# Patient Record
Sex: Male | Born: 1970 | Race: White | Hispanic: No | Marital: Married | Smoking: Former smoker
Health system: Southern US, Community
[De-identification: ages and names within clinical notes are randomized; demographics above are authoritative.]

## PROBLEM LIST (undated history)

## (undated) DIAGNOSIS — L309 Dermatitis, unspecified: Secondary | ICD-10-CM

## (undated) HISTORY — PX: APPENDECTOMY: SHX54

## (undated) HISTORY — DX: Dermatitis, unspecified: L30.9

---

## 2010-02-10 ENCOUNTER — Encounter (INDEPENDENT_AMBULATORY_CARE_PROVIDER_SITE_OTHER): Payer: Self-pay | Admitting: General Surgery

## 2010-02-10 ENCOUNTER — Ambulatory Visit (HOSPITAL_COMMUNITY): Admission: EM | Admit: 2010-02-10 | Discharge: 2010-02-11 | Payer: Self-pay | Admitting: Emergency Medicine

## 2010-07-29 DIAGNOSIS — S8010XA Contusion of unspecified lower leg, initial encounter: Secondary | ICD-10-CM

## 2010-08-04 ENCOUNTER — Ambulatory Visit: Payer: Self-pay | Admitting: Family Medicine

## 2010-08-04 DIAGNOSIS — F17200 Nicotine dependence, unspecified, uncomplicated: Secondary | ICD-10-CM | POA: Insufficient documentation

## 2010-08-18 ENCOUNTER — Ambulatory Visit: Payer: Self-pay | Admitting: Family Medicine

## 2010-11-18 NOTE — Assessment & Plan Note (Signed)
Summary: TO BE EST/SKIN RASH/NJR   Vital Signs:  Patient profile:   40 year old male Height:      72 inches Weight:      161 pounds BMI:     21.91 Temp:     98.4 degrees F oral BP sitting:   102 / 78  (left arm) Cuff size:   regular  Vitals Entered By: Kern Reap CMA Duncan Dull) (August 04, 2010 12:32 PM) CC: left leg pain, new to establish care Is Patient Diabetic? No   CC:  left leg pain and new to establish care.  History of Present Illness: Grant Castaneda is a 40 year old, married male, smoker, who comes in today as a new patient for evaluation of a contusion on his left leg x 1 week and to discuss a smoking cessation program.  He fell at a friends house and contused.  The medial side of his left leg about a week ago and still sore.  Last tetanus status he was at age 40.  Therefore, will give a booster today because there is a slight abrasion.  He tried a friend's chantix, however, he stopped it because it gave him a bad headache.  His wife also smokes and would like to quit too.  Preventive Screening-Counseling & Management  Alcohol-Tobacco     Smoking Status: current     Packs/Day: 0.5  Hep-HIV-STD-Contraception     Dental Visit-last 6 months yes      Drug Use:  no.    Allergies (verified): No Known Drug Allergies  Past History:  Past medical, surgical, family and social histories (including risk factors) reviewed, and no changes noted (except as noted below).  Past Medical History: ezcema  Past Surgical History: Appendectomy  Family History: Reviewed history and no changes required. Father: diverticulitis Mother: DM II Siblings: 1 brother - achalasia  Social History: Reviewed history and no changes required. 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 no    Any prior history of allergic reactions to egg and/or gelatin? no    Do you have a sensitivity to the preservative Thimersol? no    Do you have a past history of Guillan-Barre Syndrome? no    Do you currently have an acute febrile illness? no    Have you ever had a severe reaction to latex? no    Vaccine information given and explained to patient? yes    Are you currently pregnant? no    Lot Number:[id number]   Exp Date:04/18/2011   Site Given  Right  Deltoid IM   Physical Exam  General:  Well-developed,well-nourished,in no acute distress; alert,appropriate and cooperative throughout examination Msk:    The left knee joint is intact.  There is an abrasion on the medial side with some soft tissue bleeding   Problems:  Medical Problems Added: 1)  Dx of Tobacco Use  (ICD-305.1) 2)  Dx of Contusion, Lower Leg, Left  (ICD-924.10)  Impression & Recommendations:  Problem # 1:  TOBACCO USE (ICD-305.1) Assessment New  His updated medication list for this problem includes:    Chantix Continuing Month Pak 1 Mg Tabs (Varenicline tartrate) ..... Uad  Orders: Tobacco  use cessation intermediate 3-10 minutes (99406)  Problem # 2:  CONTUSION, LOWER LEG, LEFT (ICD-924.10) Assessment: New  Orders: Tobacco use cessation intermediate 3-10 minutes (99406)  Complete Medication List: 1)  Chantix Continuing Month Pak 1 Mg Tabs (Varenicline tartrate) .... Uad  Other Orders: Admin 1st Vaccine (16109) Flu Vaccine 99yrs + (60454) Tdap => 73yrs IM (09811) Admin of Any Addtl Vaccine (91478)  Patient Instructions: 1)  take 600 mg of Motrin 3 times a day with food, and elevation and ice until the soreness in the knee goes away. 2)  Began in the chantix by taking a half of a blue tablet........... Marland Kitchen5 mg............. daily in the morning.  Return in two weeks for follow-up. 3)  Also begin to taper by two  cigarettes per week. 4)  Have your wife join you in this endeavor Prescriptions: CHANTIX CONTINUING MONTH PAK 1 MG TABS (VARENICLINE TARTRATE) UAD  #1 x 2   Entered and Authorized by:   Roderick Pee MD   Signed by:   Roderick Pee MD on 08/04/2010   Method used:   Print then Give to Patient   RxID:   [id number]    Orders Added: 1)  New Patient Level IV [62952] 2)  Tobacco use cessation intermediate 3-10 minutes [99406] 3)  Admin 1st Vaccine [90471] 4)  Flu Vaccine 69yrs + [84132] 5)  Tdap => 54yrs IM [90715] 6)  Admin of Any Addtl Vaccine [44010]   Immunizations Administered:  Tetanus Vaccine:    Vaccine Type: Tdap    Site: left deltoid    Mfr: GlaxoSmithKline    Dose: 0.5 ml    Route: IM    Given by: Kern Reap CMA (AAMA)    Exp. Date: 08/07/2012    Lot #: [id number]    VIS given: 09/05/08 version given August 04, 2010.    Physician counseled: yes   Immunizations Administered:  Tetanus Vaccine:    Vaccine Type: Tdap    Site: left deltoid    Mfr: GlaxoSmithKline    Dose: 0.5 ml    Route: IM    Given by: Kern Reap CMA (AAMA)    Exp. Date: 08/07/2012    Lot #: [id number]    VIS given: 09/05/08 version given August 04, 2010.    Physician counseled: yes

## 2010-11-18 NOTE — Assessment & Plan Note (Signed)
Summary: 2 WK ROV/NJR   Vital Signs:  Patient profile:   40 year old male Weight:      165 pounds Temp:     98.2 degrees F oral BP sitting:   120 / 80  (left arm) Cuff size:   regular  Vitals Entered By: Kern Reap CMA Duncan Dull) (August 18, 2010 4:13 PM) CC: follow-up visit   CC:  follow-up visit.  History of Present Illness: Ranald is a 40 year old male, who comes back today for follow-up of smoking cessation program.  We started him on a half a chantix in the morning and is done very well.  He stayed creases.  Cigarette consumption by 50%. ......... to 7 cigarettes per day.  He is having some sleep dysfunction.  He wakes up at 4 o'clock in the morning can't go back to sleep.  Otherwise, no major side effects  Allergies: No Known Drug Allergies  Past History:  Past medical, surgical, family and social histories (including risk factors) reviewed for relevance to current acute and chronic problems.  Past Medical History: Reviewed history from 08/04/2010 and no changes required. ezcema  Past Surgical History: Reviewed history from 08/04/2010 and no changes required. Appendectomy  Family History: Reviewed history from 08/04/2010 and no changes required. Father: diverticulitis Mother: DM II Siblings: 1 brother - achalasia  Social History: Reviewed history from 08/04/2010 and no changes required. 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 Alert and cooperative with normal attention span and concentration. No apparent delusions, illusions, hallucinations   Impression & Recommendations:  Problem # 1:  TOBACCO USE (ICD-305.1) Assessment Improved  His updated medication list for this problem includes:    Chantix Continuing Month Pak 1 Mg Tabs (Varenicline tartrate) ..... Uad  Complete Medication List: 1)  Chantix Continuing Month Pak 1 Mg Tabs (Varenicline tartrate) .... Uad 2)  Amitriptyline Hcl 25 Mg Tabs (Amitriptyline hcl) .Marland Kitchen.. 1 tab @ bedtime  Patient Instructions: 1)  continued D. chantix one half tablet q.a.m. and taper his cigarettes by one per week 7......... 6......... 5 etc. 2)  I would also recommend the Arline Asp also take a half a tablet a day. 3)  Return p.r.n.Marland Kitchen 4)  Elavil 25 mg nightly should help with the sleep dysfunction Prescriptions: AMITRIPTYLINE HCL 25 MG TABS (AMITRIPTYLINE HCL) 1 tab @ bedtime  #30 x 4   Entered and Authorized by:   Roderick Pee MD   Signed by:   Roderick Pee MD on 08/18/2010   Method used:   Print then Give to Patient   RxID:   [id number]    Orders Added: 1)  Est. Patient Level III [14782]

## 2010-12-16 ENCOUNTER — Telehealth: Payer: Self-pay | Admitting: Family Medicine

## 2010-12-16 DIAGNOSIS — B001 Herpesviral vesicular dermatitis: Secondary | ICD-10-CM

## 2010-12-16 MED ORDER — ACYCLOVIR 400 MG PO TABS: 400.0000 mg | ORAL_TABLET | Freq: Three times a day (TID) | ORAL | Status: AC

## 2010-12-16 NOTE — Telephone Encounter (Signed)
Pt has 2 cold sores and would like a new rx for Valtrex sent to [facility]. Pt has a  cpx next month.

## 2011-01-06 LAB — BASIC METABOLIC PANEL
BUN: 8 mg/dL (ref 6–23)
Calcium: 8.9 mg/dL (ref 8.4–10.5)
Chloride: 105 mEq/L (ref 96–112)
Sodium: 137 mEq/L (ref 135–145)

## 2011-01-06 LAB — URINALYSIS, ROUTINE W REFLEX MICROSCOPIC
Bilirubin Urine: NEGATIVE
Glucose, UA: NEGATIVE mg/dL
Hgb urine dipstick: NEGATIVE
Ketones, ur: NEGATIVE mg/dL
Specific Gravity, Urine: 1.007 (ref 1.005–1.030)
pH: 6 (ref 5.0–8.0)

## 2011-01-06 LAB — CBC
HCT: 44.3 % (ref 39.0–52.0)
Hemoglobin: 14.9 g/dL (ref 13.0–17.0)
MCHC: 33.8 g/dL (ref 30.0–36.0)
MCV: 89.4 fL (ref 78.0–100.0)
Platelets: 213 10*3/uL (ref 150–400)
RBC: 4.95 MIL/uL (ref 4.22–5.81)

## 2011-01-06 LAB — DIFFERENTIAL
Basophils Absolute: 0.1 10*3/uL (ref 0.0–0.1)
Basophils Relative: 1 % (ref 0–1)
Lymphs Abs: 2.2 10*3/uL (ref 0.7–4.0)
Neutrophils Relative %: 74 % (ref 43–77)

## 2011-01-09 ENCOUNTER — Other Ambulatory Visit (INDEPENDENT_AMBULATORY_CARE_PROVIDER_SITE_OTHER): Payer: BC Managed Care – PPO

## 2011-01-09 DIAGNOSIS — Z0000 Encounter for general adult medical examination without abnormal findings: Secondary | ICD-10-CM

## 2011-01-09 DIAGNOSIS — E785 Hyperlipidemia, unspecified: Secondary | ICD-10-CM

## 2011-01-09 LAB — CBC WITH DIFFERENTIAL/PLATELET
Basophils Absolute: 0.1 10*3/uL (ref 0.0–0.1)
Basophils Relative: 1 % (ref 0.0–3.0)
Eosinophils Relative: 2.5 % (ref 0.0–5.0)
Hemoglobin: 15 g/dL (ref 13.0–17.0)
MCHC: 33.9 g/dL (ref 30.0–36.0)
Neutro Abs: 3.3 10*3/uL (ref 1.4–7.7)
RDW: 14.6 % (ref 11.5–14.6)

## 2011-01-09 LAB — BASIC METABOLIC PANEL
CO2: 29 mEq/L (ref 19–32)
Calcium: 9.4 mg/dL (ref 8.4–10.5)
Creatinine, Ser: 0.8 mg/dL (ref 0.4–1.5)
GFR: 112.25 mL/min (ref >60.00)
Glucose, Bld: 89 mg/dL (ref 70–99)
Potassium: 5.2 mEq/L — ABNORMAL HIGH (ref 3.5–5.1)
Sodium: 144 mEq/L (ref 135–145)

## 2011-01-09 LAB — POCT URINALYSIS DIPSTICK
Bilirubin, UA: NEGATIVE
Blood, UA: NEGATIVE
Leukocytes, UA: NEGATIVE
Nitrite, UA: NEGATIVE
Spec Grav, UA: 1.025
pH, UA: 5.5

## 2011-01-09 LAB — LIPID PANEL
Cholesterol: 202 mg/dL — ABNORMAL HIGH (ref 0–200)
HDL: 47.5 mg/dL (ref >39.00)
VLDL: 19.8 mg/dL (ref 0.0–40.0)

## 2011-01-09 LAB — HEPATIC FUNCTION PANEL: ALT: 60 U/L — ABNORMAL HIGH (ref 0–53)

## 2011-01-09 LAB — TSH: TSH: 0.77 u[IU]/mL (ref 0.35–5.50)

## 2011-01-16 ENCOUNTER — Encounter: Payer: Self-pay | Admitting: Family Medicine

## 2011-01-19 ENCOUNTER — Ambulatory Visit (INDEPENDENT_AMBULATORY_CARE_PROVIDER_SITE_OTHER): Payer: BC Managed Care – PPO | Admitting: Family Medicine

## 2011-01-19 ENCOUNTER — Encounter: Payer: Self-pay | Admitting: Family Medicine

## 2011-01-19 DIAGNOSIS — F17200 Nicotine dependence, unspecified, uncomplicated: Secondary | ICD-10-CM

## 2011-01-19 DIAGNOSIS — Z0000 Encounter for general adult medical examination without abnormal findings: Secondary | ICD-10-CM

## 2011-01-19 MED ORDER — LORAZEPAM 0.5 MG PO TABS: 0.5000 mg | ORAL_TABLET | Freq: Two times a day (BID) | ORAL | Status: DC

## 2011-01-19 NOTE — Patient Instructions (Signed)
Instead of smoking, use the Ativan, .5, p.r.n.  Return in one year for follow-up.  Remind Cindy to come in for general medical exam

## 2011-01-19 NOTE — Progress Notes (Signed)
  Subjective:    Patient ID: Grant Castaneda, male    DOB: 12/30/1970, 40 y.o.   MRN: [id number]  Grant Castaneda is a 40 year old, married male, now nonsmoker, who comes in today for general physical examination  We started him on the chantix program one half tablet daily, and he quit smoking after 3 weeks.  In the last month.  He said to cigarettes.  If he gets very anxious.  She smokes a cigarette.  In the past.  He been given Xanax to take p.r.n. For anxiety.  I recommend we give him a low-dose Ativan to take as needed, so that he doesn't smoke at all.  In this process.  His wife is also stop smoking.  He has dry skin on his lower extremities and a history of allergic rhinitis.    Review of Systems  Constitutional: Negative.   HENT: Negative.   Eyes: Negative.   Respiratory: Negative.   Cardiovascular: Negative.   Gastrointestinal: Negative.   Genitourinary: Negative.   Musculoskeletal: Negative.   Skin: Negative.   Neurological: Negative.   Hematological: Negative.   Psychiatric/Behavioral: Negative.        Objective:   Physical Exam  Constitutional: He is oriented to person, place, and time. He appears well-developed and well-nourished.  HENT:  Head: Normocephalic and atraumatic.  Right Ear: External ear normal.  Left Ear: External ear normal.  Nose: Nose normal.  Mouth/Throat: Oropharynx is clear and moist.  Eyes: Conjunctivae and EOM are normal. Pupils are equal, round, and reactive to light.  Neck: Normal range of motion. Neck supple. No JVD present. No tracheal deviation present. No thyromegaly present.  Cardiovascular: Normal rate, regular rhythm, normal heart sounds and intact distal pulses.  Exam reveals no gallop and no friction rub.   No murmur heard. Pulmonary/Chest: Effort normal and breath sounds normal. No stridor. No respiratory distress. He has no wheezes. He has no rales. He exhibits no tenderness.  Abdominal: Soft. Bowel sounds are normal. He exhibits no distension  and no mass. There is no tenderness. There is no rebound and no guarding.  Genitourinary: Prostate normal and penis normal. No penile tenderness.  Musculoskeletal: Normal range of motion. He exhibits no edema and no tenderness.  Lymphadenopathy:    He has no cervical adenopathy.  Neurological: He is alert and oriented to person, place, and time. He has normal reflexes. No cranial nerve deficit. He exhibits normal muscle tone.  Skin: Skin is warm and dry. No rash noted. No erythema. No pallor.  Psychiatric: He has a normal mood and affect. His behavior is normal. Judgment and thought content normal.          Assessment & Plan:  Ex-smoker,,,,,,,,,,,, p.r.n. Ativan for anxiety.  Eczema advised OTC cortisone cream along with a moisturizer to use p.r.n. And Zyrtec 10 mg plain  nightly

## 2011-07-23 ENCOUNTER — Ambulatory Visit (INDEPENDENT_AMBULATORY_CARE_PROVIDER_SITE_OTHER): Payer: BC Managed Care – PPO | Admitting: Family Medicine

## 2011-07-23 ENCOUNTER — Encounter: Payer: Self-pay | Admitting: Family Medicine

## 2011-07-23 DIAGNOSIS — IMO0001 Reserved for inherently not codable concepts without codable children: Secondary | ICD-10-CM | POA: Insufficient documentation

## 2011-07-23 DIAGNOSIS — F17200 Nicotine dependence, unspecified, uncomplicated: Secondary | ICD-10-CM

## 2011-07-23 DIAGNOSIS — J45901 Unspecified asthma with (acute) exacerbation: Secondary | ICD-10-CM

## 2011-07-23 MED ORDER — PREDNISONE 20 MG PO TABS: ORAL_TABLET | ORAL | Status: DC

## 2011-07-23 MED ORDER — HYDROCODONE-HOMATROPINE 5-1.5 MG/5ML PO SYRP: ORAL_SOLUTION | ORAL | Status: DC

## 2011-07-23 MED ORDER — VARENICLINE TARTRATE 1 MG PO TABS: 1.0000 mg | ORAL_TABLET | Freq: Two times a day (BID) | ORAL | Status: DC

## 2011-07-23 NOTE — Patient Instructions (Signed)
Take the prednisone as directed.  Drink lots of water.  Hydromet one half to 1 teaspoon at bedtime as needed.  Chantix one half tab daily x 4 months.................Marland Kitchen refills if you need more

## 2011-07-23 NOTE — Progress Notes (Signed)
  Subjective:    Patient ID: Grant Castaneda, male    DOB: 06-Mar-1971, 40 y.o.   MRN: [id number]  HPI Dacota is a 40 year old male, smoker, about 6 cigarettes a day in the evening.  He doesn't smoke during the day...... He quit smoking with the chantix program in 2011......... Who comes in today with a 9-day history of a nonproductive cough.  On two evenings.  He had temperature of 101.  That went away, sore throat, and cough.  No sputum production.  He does have allergic rhinitis.  No history of previous asthma    Review of Systems    General and pulmonary views systems otherwise negative Objective:   Physical Exam Well-developed well-nourished, thin male, in no acute distress.  Examination HEENT negative.  Neck was supple.  No adenopathy.  Lungs show symmetrical.  Breath sounds bilateral expiratory wheezing mild       Assessment & Plan:  Viral syndrome.  Allergic rhinitis.  Asthma.  Tobacco abuse............ Plan DC smoking begin chantix program, prednisone burst and taper

## 2011-08-19 ENCOUNTER — Encounter: Payer: Self-pay | Admitting: Family Medicine

## 2011-08-19 ENCOUNTER — Ambulatory Visit (INDEPENDENT_AMBULATORY_CARE_PROVIDER_SITE_OTHER): Payer: BC Managed Care – PPO | Admitting: Family Medicine

## 2011-08-19 DIAGNOSIS — J45901 Unspecified asthma with (acute) exacerbation: Secondary | ICD-10-CM

## 2011-08-19 DIAGNOSIS — F17200 Nicotine dependence, unspecified, uncomplicated: Secondary | ICD-10-CM

## 2011-08-19 DIAGNOSIS — IMO0001 Reserved for inherently not codable concepts without codable children: Secondary | ICD-10-CM

## 2011-08-19 MED ORDER — PREDNISONE 20 MG PO TABS: ORAL_TABLET | ORAL | Status: DC

## 2011-08-19 MED ORDER — CLARITHROMYCIN ER 500 MG PO TB24: 1000.0000 mg | ORAL_TABLET | Freq: Every day | ORAL | Status: AC

## 2011-08-19 MED ORDER — HYDROCODONE-HOMATROPINE 5-1.5 MG/5ML PO SYRP: ORAL_SOLUTION | ORAL | Status: DC

## 2011-08-19 NOTE — Progress Notes (Signed)
  Subjective:    Patient ID: Samaad Hashem, male    DOB: 05/17/1971, 40 y.o.   MRN: [id number]  HPI Markeis is a 40 year old male, married, smoker, who comes in today for evaluation of a cough for 3 weeks.  We saw him a month ago with a flareup in his allergies and asthma.  We outlined a program, including prednisone burst and taper.  No smoking.  Chantix and cough syrup.  He took the prednisone, but has continued to smoke and he feels like the prednisone did help.  He is continuing to cough and wheeze.  He is bringing up yellow sputum.  No blood.  No fever.  He still smoking, but down to 3 to 5 cigarettes per day   Review of Systems    General and pulmonary review of systems otherwise negative Objective:   Physical Exam  Well-developed well-nourished, male in no acute distress.  HEENT negative.  Neck is supple.  No adenopathy.  Lungs show symmetrical.  Breath sounds inspiratory and expiratory wheezing      Assessment & Plan:  Asthma/tobacco abuse........... Plan no smoking, drink lots of water, Biaxin, 500 b.i.d., restart chantix, and prednisone, Tagamet nightly, p.r.n. For cough, follow-up in one week

## 2011-08-19 NOTE — Patient Instructions (Signed)
Drink lots of water.  No smoking!!!!!!!!!!!!!!.  Chantix one half tab daily. Biaxin one tab twice daily.  Prednisone 3 tablets now then starting tomorrow two tabs daily in the morning.  Hydromet one half to 1 teaspoon at bedtime p.r.n. For cough.  Return next Tuesday for follow-up

## 2011-08-25 ENCOUNTER — Ambulatory Visit (INDEPENDENT_AMBULATORY_CARE_PROVIDER_SITE_OTHER): Payer: BC Managed Care – PPO | Admitting: Family Medicine

## 2011-08-25 ENCOUNTER — Encounter: Payer: Self-pay | Admitting: Family Medicine

## 2011-08-25 DIAGNOSIS — J45901 Unspecified asthma with (acute) exacerbation: Secondary | ICD-10-CM

## 2011-08-25 DIAGNOSIS — IMO0001 Reserved for inherently not codable concepts without codable children: Secondary | ICD-10-CM

## 2011-08-25 DIAGNOSIS — F17200 Nicotine dependence, unspecified, uncomplicated: Secondary | ICD-10-CM

## 2011-08-25 MED ORDER — VARENICLINE TARTRATE 1 MG PO TABS: 1.0000 mg | ORAL_TABLET | Freq: Two times a day (BID) | ORAL | Status: DC

## 2011-08-25 NOTE — Patient Instructions (Signed)
Taper the prednisone as we outlined.  Finished the antibiotics.  Drink lots of liquids.  No smoking.  Chantix one half tab daily for 4 to 6 months of

## 2011-08-25 NOTE — Progress Notes (Signed)
  Subjective:    Patient ID: Grant Castaneda, male    DOB: [birth year], 40 y.o.   MRN: [id number]  HPI Grant Castaneda is a 40 year old, married male, smoker, who has only smoked 4 cigarettes in the past week, who comes in today for follow-up of asthma.  We saw him a week ago because the prednisone had not resolved.  His asthma.  However, he continued to smoke.  He stopped smoking.  We started the prednisone 40 mg for day for 3 days with a taper and we added Biaxin one twice daily.  We also gave him a prescription for chantix base not gotten filled yet.  He says overall he feels 40% better   Review of Systems    General pulmonary view is systems otherwise negative Objective:   Physical Exam  Well-developed and nourished, male in no acute distress.  Examination of the HEENT were negative.  Neck was supple.  No adenopathy.  Lungs showed symmetrical.  Breath sounds delayed expiratory wheezing bilaterally      Assessment & Plan:  Asthma much improved.  Taper prednisone.  Tobacco abuse, and can again encouraged to quit and use the chantix

## 2011-09-15 ENCOUNTER — Other Ambulatory Visit: Payer: Self-pay | Admitting: Family Medicine

## 2012-01-14 ENCOUNTER — Other Ambulatory Visit (INDEPENDENT_AMBULATORY_CARE_PROVIDER_SITE_OTHER): Payer: BC Managed Care – PPO

## 2012-01-14 DIAGNOSIS — Z0000 Encounter for general adult medical examination without abnormal findings: Secondary | ICD-10-CM

## 2012-01-14 LAB — TSH: TSH: 1.28 u[IU]/mL (ref 0.35–5.50)

## 2012-01-14 LAB — POCT URINALYSIS DIPSTICK
Bilirubin, UA: NEGATIVE
Glucose, UA: NEGATIVE
Ketones, UA: NEGATIVE
pH, UA: 6

## 2012-01-14 LAB — CBC WITH DIFFERENTIAL/PLATELET
Basophils Relative: 0.9 % (ref 0.0–3.0)
Eosinophils Absolute: 0.1 10*3/uL (ref 0.0–0.7)
Eosinophils Relative: 2.1 % (ref 0.0–5.0)
HCT: 45.2 % (ref 39.0–52.0)
MCHC: 34 g/dL (ref 30.0–36.0)
Monocytes Relative: 10.5 % (ref 3.0–12.0)
RBC: 5.19 Mil/uL (ref 4.22–5.81)
RDW: 14.3 % (ref 11.5–14.6)

## 2012-01-14 LAB — BASIC METABOLIC PANEL
Chloride: 102 mEq/L (ref 96–112)
Creatinine, Ser: 0.9 mg/dL (ref 0.4–1.5)
Glucose, Bld: 86 mg/dL (ref 70–99)
Potassium: 4.7 mEq/L (ref 3.5–5.1)

## 2012-01-14 LAB — HEPATIC FUNCTION PANEL
ALT: 37 U/L (ref 0–53)
AST: 29 U/L (ref 0–37)
Bilirubin, Direct: 0.1 mg/dL (ref 0.0–0.3)
Total Bilirubin: 0.9 mg/dL (ref 0.3–1.2)
Total Protein: 7.9 g/dL (ref 6.0–8.3)

## 2012-01-14 LAB — LDL CHOLESTEROL, DIRECT: Direct LDL: 195.7 mg/dL

## 2012-01-14 LAB — LIPID PANEL
Total CHOL/HDL Ratio: 5
VLDL: 15.2 mg/dL (ref 0.0–40.0)

## 2012-01-21 ENCOUNTER — Encounter: Payer: Self-pay | Admitting: Family Medicine

## 2012-01-21 ENCOUNTER — Ambulatory Visit (INDEPENDENT_AMBULATORY_CARE_PROVIDER_SITE_OTHER): Payer: BC Managed Care – PPO | Admitting: Family Medicine

## 2012-01-21 VITALS — BP 110/80 | Temp 98.0°F | Ht 71.0 in | Wt 176.0 lb

## 2012-01-21 DIAGNOSIS — E785 Hyperlipidemia, unspecified: Secondary | ICD-10-CM

## 2012-01-21 DIAGNOSIS — F17200 Nicotine dependence, unspecified, uncomplicated: Secondary | ICD-10-CM

## 2012-01-21 DIAGNOSIS — L309 Dermatitis, unspecified: Secondary | ICD-10-CM

## 2012-01-21 DIAGNOSIS — L259 Unspecified contact dermatitis, unspecified cause: Secondary | ICD-10-CM

## 2012-01-21 MED ORDER — LORAZEPAM 0.5 MG PO TABS: ORAL_TABLET | ORAL | Status: DC

## 2012-01-21 MED ORDER — TRIAMCINOLONE ACETONIDE 0.025 % EX OINT: TOPICAL_OINTMENT | CUTANEOUS | Status: DC

## 2012-01-21 MED ORDER — VARENICLINE TARTRATE 1 MG PO TABS: ORAL_TABLET | ORAL | Status: DC

## 2012-01-21 NOTE — Patient Instructions (Signed)
Continue the Ativan daily when necessary  Consider restarting the Chantix one half tab daily in the morning for a couple months to help you stop smoking completely  Encouraged Aram Beecham to come see [place] so we can sit down with her and develop some strategies to help her stop smoking  Return in one year sooner if any problems

## 2012-01-21 NOTE — Progress Notes (Signed)
  Subjective:    Patient ID: Grant Castaneda, male    DOB: 1971-10-10, 41 y.o.   MRN: [id number]  HPI Grant Castaneda is a 41 year old married male smoker but he stamp to 1-3 cigarettes a day taking Ativan 0.5 daily when necessary. He tried the Chantix and that helped him decrease his cigarette consumption from 20 a day down to one to 3 per day. His wife continues to smoke a pack of cigarettes a day plus  He gets routine eye care, dental care, tetanus 2011  He does have eczema right lower extremity   Review of Systems  Constitutional: Negative.   HENT: Negative.   Eyes: Negative.   Respiratory: Negative.   Cardiovascular: Negative.   Gastrointestinal: Negative.   Genitourinary: Negative.   Musculoskeletal: Negative.   Skin: Negative.   Neurological: Negative.   Hematological: Negative.   Psychiatric/Behavioral: Negative.        Objective:   Physical Exam  Constitutional: He is oriented to person, place, and time. He appears well-developed and well-nourished.  HENT:  Head: Normocephalic and atraumatic.  Right Ear: External ear normal.  Left Ear: External ear normal.  Nose: Nose normal.  Mouth/Throat: Oropharynx is clear and moist.  Eyes: Conjunctivae and EOM are normal. Pupils are equal, round, and reactive to light.  Neck: Normal range of motion. Neck supple. No JVD present. No tracheal deviation present. No thyromegaly present.  Cardiovascular: Normal rate, regular rhythm, normal heart sounds and intact distal pulses.  Exam reveals no gallop and no friction rub.   No murmur heard. Pulmonary/Chest: Effort normal and breath sounds normal. No stridor. No respiratory distress. He has no wheezes. He has no rales. He exhibits no tenderness.  Abdominal: Soft. Bowel sounds are normal. He exhibits no distension and no mass. There is no tenderness. There is no rebound and no guarding.  Genitourinary: Rectum normal, prostate normal and penis normal. Guaiac negative stool. No penile tenderness.    Musculoskeletal: Normal range of motion. He exhibits no edema and no tenderness.  Lymphadenopathy:    He has no cervical adenopathy.  Neurological: He is alert and oriented to person, place, and time. He has normal reflexes. No cranial nerve deficit. He exhibits normal muscle tone.  Skin: Skin is warm and dry. No rash noted. No erythema. No pallor.       Total body skin exam normal except for eczema right lower extremity  Psychiatric: He has a normal mood and affect. His behavior is normal. Judgment and thought content normal.          Assessment & Plan:  Healthy male  Tobacco abuse continue the Ativan 0.5 daily also encouraged to restart the Chantix one half tablet daily to stop smoking completely  Hyperlipidemia,,,,,,,,,,, 12 diet and exercise followup lipid panel in 2 months

## 2012-03-25 ENCOUNTER — Other Ambulatory Visit: Payer: BC Managed Care – PPO

## 2012-09-21 ENCOUNTER — Encounter (HOSPITAL_COMMUNITY): Payer: Self-pay | Admitting: Emergency Medicine

## 2012-09-21 ENCOUNTER — Emergency Department (HOSPITAL_COMMUNITY)
Admission: EM | Admit: 2012-09-21 | Discharge: 2012-09-21 | Disposition: A | Discharge status: Home / Self Care | Payer: PRIVATE HEALTH INSURANCE | Attending: Emergency Medicine | Admitting: Emergency Medicine

## 2012-09-21 ENCOUNTER — Telehealth: Payer: Self-pay | Admitting: Family Medicine

## 2012-09-21 ENCOUNTER — Emergency Department (HOSPITAL_COMMUNITY): Payer: PRIVATE HEALTH INSURANCE

## 2012-09-21 DIAGNOSIS — R0789 Other chest pain: Secondary | ICD-10-CM | POA: Insufficient documentation

## 2012-09-21 DIAGNOSIS — R0602 Shortness of breath: Secondary | ICD-10-CM | POA: Insufficient documentation

## 2012-09-21 DIAGNOSIS — Z79899 Other long term (current) drug therapy: Secondary | ICD-10-CM | POA: Insufficient documentation

## 2012-09-21 DIAGNOSIS — Z7982 Long term (current) use of aspirin: Secondary | ICD-10-CM | POA: Insufficient documentation

## 2012-09-21 DIAGNOSIS — L259 Unspecified contact dermatitis, unspecified cause: Secondary | ICD-10-CM | POA: Insufficient documentation

## 2012-09-21 DIAGNOSIS — F17200 Nicotine dependence, unspecified, uncomplicated: Secondary | ICD-10-CM | POA: Insufficient documentation

## 2012-09-21 DIAGNOSIS — F411 Generalized anxiety disorder: Secondary | ICD-10-CM | POA: Insufficient documentation

## 2012-09-21 DIAGNOSIS — F419 Anxiety disorder, unspecified: Secondary | ICD-10-CM

## 2012-09-21 LAB — CBC
Hemoglobin: 15.5 g/dL (ref 13.0–17.0)
MCHC: 34.5 g/dL (ref 30.0–36.0)
RDW: 13.8 % (ref 11.5–15.5)
WBC: 5.5 10*3/uL (ref 4.0–10.5)

## 2012-09-21 LAB — POCT I-STAT TROPONIN I: Troponin i, poc: 0 ng/mL (ref 0.00–0.08)

## 2012-09-21 LAB — BASIC METABOLIC PANEL
Chloride: 102 mEq/L (ref 96–112)
GFR calc Af Amer: >90 mL/min (ref >90)
GFR calc non Af Amer: >90 mL/min (ref >90)
Glucose, Bld: 91 mg/dL (ref 70–99)
Potassium: 4.2 mEq/L (ref 3.5–5.1)
Sodium: 138 mEq/L (ref 135–145)

## 2012-09-21 NOTE — Telephone Encounter (Signed)
Pt sent to ED. Encounter closed.  

## 2012-09-21 NOTE — ED Notes (Signed)
Pt refuses saline lock

## 2012-09-21 NOTE — ED Provider Notes (Signed)
History     CSN: [id number]  Arrival date & time 09/21/12  0907   First MD Initiated Contact with Patient 09/21/12 [phone]      Chief Complaint  Patient presents with  . Chest Pain  . Anxiety    (Consider location/radiation/quality/duration/timing/severity/associated sxs/prior treatment) HPI Comments: Grant Castaneda presents ambulatory for evaluation of chest discomfort.  He states he was in the shower this morning when he experienced sudden onset chest discomfort.  He describes feeling hot and flushed and had to turn the temperature down in the shower.  He had numbness and tingling in his bilateral arms and some perioral numbness.  He was able to exit the shower, sat on the bed, and the symptoms improved without completely resolving. He took 2 aspirin and a po ativan.  He continued his daily routine however the vague disomfort persisted.  He states he has significant work related stress and recently has a stressful situation at home involving his teenage stepdaughter.  He reports a previous history of some anxiety and has taken xanax in the past which was changed by his current PMD to ativan.  He denies any other issues.  Patient is a 41 y.o. male presenting with chest pain and anxiety. The history is provided by the patient. No language interpreter was used.  Chest Pain The chest pain began 3 - 5 hours ago. Chest pain occurs constantly. The chest pain is improving. At its most intense, the pain is at 5/10. The pain is currently at 1/10. The severity of the pain is moderate. The quality of the pain is described as tightness. The pain does not radiate. Primary symptoms include shortness of breath. Pertinent negatives for primary symptoms include no fever, no fatigue, no syncope, no cough, no wheezing, no palpitations, no abdominal pain, no nausea, no vomiting, no dizziness and no altered mental status.  Pertinent negatives for associated symptoms include no claudication, no diaphoresis, no lower extremity  edema, no near-syncope, no numbness, no orthopnea, no paroxysmal nocturnal dyspnea and no weakness. He tried aspirin for the symptoms. Risk factors include male gender and smoking/tobacco exposure.    Anxiety Associated symptoms include chest pain and shortness of breath. Pertinent negatives include no abdominal pain.    Past Medical History  Diagnosis Date  . Eczema     Past Surgical History  Procedure Date  . Appendectomy     Family History  Problem Relation Age of Onset  . Diverticulitis Father   . Achalasia Brother     History  Substance Use Topics  . Smoking status: Current Every Day Smoker  . Smokeless tobacco: Not on file  . Alcohol Use: Yes      Review of Systems  Constitutional: Negative for fever, diaphoresis and fatigue.  Respiratory: Positive for shortness of breath. Negative for cough and wheezing.   Cardiovascular: Positive for chest pain. Negative for palpitations, orthopnea, claudication, syncope and near-syncope.  Gastrointestinal: Negative for nausea, vomiting and abdominal pain.  Genitourinary: Negative.   Neurological: Negative for dizziness, weakness and numbness.  Psychiatric/Behavioral: Negative.  Negative for altered mental status.    Allergies  Review of patient's allergies indicates no known allergies.  Home Medications   Current Outpatient Rx  Name  Route  Sig  Dispense  Refill  . ASPIRIN 325 MG PO TABS   Oral   Take 625 mg by mouth daily as needed. For panic         . GUAIFENESIN 100 MG/5ML PO LIQD   Oral  Take 200 mg by mouth 3 (three) times daily as needed. For cough         . LORAZEPAM 0.5 MG PO TABS   Oral   Take 1 mg by mouth 2 (two) times daily. 1 by mouth twice a day         . TRIAMCINOLONE ACETONIDE 0.025 % EX OINT   Topical   Apply 1 application topically 2 (two) times daily as needed. For eczema           BP 113/75  Pulse 62  Temp 97.9 F (36.6 C) (Oral)  Resp 18  SpO2 98%  Physical Exam  Nursing  note and vitals reviewed. Constitutional: He appears well-developed and well-nourished. No distress.  HENT:  Head: Normocephalic and atraumatic.  Right Ear: External ear normal.  Left Ear: External ear normal.  Nose: Nose normal.  Mouth/Throat: Oropharynx is clear and moist. No oropharyngeal exudate.  Eyes: Pupils are equal, round, and reactive to light. Right eye exhibits no discharge. Left eye exhibits no discharge. No scleral icterus.  Neck: Normal range of motion. Neck supple. No JVD present. No tracheal deviation present.  Cardiovascular: Normal rate, regular rhythm, normal heart sounds and intact distal pulses.  Exam reveals no gallop and no friction rub.   No murmur heard. Pulmonary/Chest: Effort normal and breath sounds normal. No stridor. No respiratory distress. He has no wheezes. He has no rales. He exhibits no tenderness.  Abdominal: Soft. Bowel sounds are normal. He exhibits no distension and no mass. There is no tenderness. There is no rebound and no guarding.  Musculoskeletal: Normal range of motion. He exhibits no edema and no tenderness.  Lymphadenopathy:    He has no cervical adenopathy.  Neurological: He is alert.  Skin: Skin is warm and dry. No rash noted. He is not diaphoretic. No erythema. No pallor.  Psychiatric: His behavior is normal. Judgment normal. His mood appears anxious. His affect is not angry, not blunt, not labile and not inappropriate. His speech is not rapid and/or pressured, not delayed, not tangential and not slurred. He is not agitated, not aggressive, is not hyperactive, not slowed, not withdrawn, not actively hallucinating and not combative. Cognition and memory are normal. Cognition and memory are not impaired. He does not express impulsivity or inappropriate judgment. He does not exhibit a depressed mood. He is communicative. He exhibits normal recent memory and normal remote memory. He is attentive.    ED Course  Procedures (including critical care  time)   Labs Reviewed  POCT I-STAT TROPONIN I  CBC  BASIC METABOLIC PANEL   No results found.   No diagnosis found.   Date: 09/21/2012  Rate: 72 bpm  Rhythm: normal sinus rhythm  QRS Axis: normal  Intervals: normal  ST/T Wave abnormalities: normal  Conduction Disutrbances:none  Narrative Interpretation:   Old EKG Reviewed: none available      MDM  Pt presents for evaluation of chest tightness and a near syncopal event this morning in the shower.  He appears nontoxic, note mildly elevated BP, NAD.  His EKG demonstrates no evidence of acute ischemia.  Will obtain a screening evaluation that includes a CXR, basic labs, and trop.  He denies risk factors for early CAD and thromboembolic event.  1610.  Pt is stable, NAD.  Pain continues to be resolved.  2nd trop is negative.  Will discharge home with instructions to f/u with his PMD and establish care with a local cardiologist.  His description of symptoms appears  to be more consistent with panic and acute anxiety as opposed to cardiac disease.  Discussed indications for immediate return to the emergency department.      Tobin Chad, MD 09/21/12 [phone]

## 2012-09-21 NOTE — ED Notes (Signed)
Pt sts was in shower and had CP and diaphoresis that felt like panic attack; pt sts under a lot of stress with step daughters over past month and has had some anxiety; pt sts was thinking about having to wake them up for school which is very stressful time when episode occurred; pt sts feels tired at present only

## 2012-09-21 NOTE — Telephone Encounter (Signed)
°  Patient Information:  Caller Name: Nabeel  Phone: [phone]  Patient: Grant Castaneda, Grant Castaneda  Gender: Male  DOB: 1971-02-17  Age: 41 Years  PCP: Kelle Darting [facility])   Symptoms  Reason For Call & Symptoms: While showering, had chest pain and became dizzy.  Reviewed Health History In EMR: N/A  Reviewed Medications In EMR: N/A  Reviewed Allergies In EMR: N/A  Reviewed Surgeries / Procedures: Yes  Date of Onset of Symptoms: 09/21/2012  Guideline(s) Used:  Chest Pain  Disposition Per Guideline:   Call EMS 911 Now  Reason For Disposition Reached:   Chest pain lasting longer than 5 minutes and ANY of the following:  Over 17 years old Over 20 years old and at least one cardiac risk factor (i.e., high blood pressure, diabetes, high cholesterol, obesity, smoker or strong family history of heart disease) Pain is crushing, pressure-like, or heavy  Took nitroglycerin and chest pain was not relieved History of heart disease (i.e., angina, heart attack, bypass surgery, angioplasty, CHF)  Advice Given:  N/A  Office Follow Up:  Does the office need to follow up with this patient?: Yes  Instructions For The Office: Be aware of 911 disposition; patient opts to have someone take him to ED.  Patient Refused Recommendation:  Patient Will Go To ED  Patient has a 911 disposition but declined opting for someone to drive him to hospital.  RN Note:  Call came in as emergent.  Patient states he had chest pains while in shower and became dizzy.  Got out of shower and took (2) ASA; dressed got children to school and came to work.  Denies pain currently but feels like he has had a hard cardiac work-out--just doesn't feel right.  Has disposition to call 911, but declined would rather drive self.  RN stated if he declined her advice to not drive self but to have someone else drive him.  He agreed to do that.

## 2012-11-17 ENCOUNTER — Ambulatory Visit (INDEPENDENT_AMBULATORY_CARE_PROVIDER_SITE_OTHER): Payer: PRIVATE HEALTH INSURANCE | Admitting: Family Medicine

## 2012-11-17 ENCOUNTER — Encounter: Payer: Self-pay | Admitting: Family Medicine

## 2012-11-17 VITALS — BP 110/70 | Temp 97.7°F | Wt 180.0 lb

## 2012-11-17 DIAGNOSIS — M2669 Other specified disorders of temporomandibular joint: Secondary | ICD-10-CM

## 2012-11-17 DIAGNOSIS — M26629 Arthralgia of temporomandibular joint, unspecified side: Secondary | ICD-10-CM | POA: Insufficient documentation

## 2012-11-17 MED ORDER — TRAMADOL HCL 50 MG PO TABS: ORAL_TABLET | ORAL | Status: DC

## 2012-11-17 NOTE — Patient Instructions (Signed)
Soft diet  Mouth guard at bedtime  Motrin 800 mg twice daily with food  Tramadol 50 mg,,,,,, one half to one tablet at bedtime when necessary for pain  Ice packs when necessary

## 2012-11-17 NOTE — Progress Notes (Signed)
  Subjective:    Patient ID: Grant Castaneda, male    DOB: 1971/07/08, 42 y.o.   MRN: [id number]  HPI Grant Castaneda is a 42 year old married male smoker but try to quit who comes in today for evaluation of pain in his left jaw for one week  He states about a week ago he was outside in the cold weather he yawned and felt severe pain in his left jaw joint. It's been persistent since that time   Review of Systems Review of systems negative no history of trauma    Objective:   Physical Exam  Well-developed well-nourished male in no acute distress examination of the jaw shows tenderness left TMJ      Assessment & Plan:  Left TMJ syndrome plan Motrin 800 twice a day tramadol each bedtime mouth guard soft diet

## 2012-12-21 ENCOUNTER — Ambulatory Visit (INDEPENDENT_AMBULATORY_CARE_PROVIDER_SITE_OTHER): Payer: PRIVATE HEALTH INSURANCE | Admitting: Internal Medicine

## 2012-12-21 ENCOUNTER — Encounter: Payer: Self-pay | Admitting: Internal Medicine

## 2012-12-21 VITALS — BP 120/80 | HR 87 | Temp 97.6°F | Resp 18 | Wt 181.0 lb

## 2012-12-21 DIAGNOSIS — J069 Acute upper respiratory infection, unspecified: Secondary | ICD-10-CM

## 2012-12-21 MED ORDER — HYDROCODONE-HOMATROPINE 5-1.5 MG/5ML PO SYRP: 5.0000 mL | ORAL_SOLUTION | Freq: Four times a day (QID) | ORAL | Status: AC | PRN

## 2012-12-21 NOTE — Patient Instructions (Signed)
Acute bronchitis symptoms for less than 10 days are generally not helped by antibiotics.  Take over-the-counter expectorants and cough medications such as  Mucinex DM.  Call if there is no improvement in 5 to 7 days or if he developed worsening cough, fever, or new symptoms, such as shortness of breath or chest pain.    

## 2012-12-21 NOTE — Progress Notes (Signed)
  Subjective:    Patient ID: Grant Castaneda, male    DOB: May 03, 1971, 42 y.o.   MRN: [id number]  HPI  42 y/o 8 day h/o URI symptoms; d/c tobacco 13 days ago    Review of Systems  Constitutional: Positive for fever and fatigue.  HENT: Positive for congestion, sore throat and voice change.   Respiratory: Positive for cough.        Objective:   Physical Exam  Constitutional: He is oriented to person, place, and time. He appears well-developed.  HENT:  Head: Normocephalic.  Right Ear: External ear normal.  Left Ear: External ear normal.  Mild erythema oropharynx  Eyes: Conjunctivae and EOM are normal.  Neck: Normal range of motion.  Cardiovascular: Normal rate and normal heart sounds.   Pulmonary/Chest: Breath sounds normal.  Abdominal: Bowel sounds are normal.  Musculoskeletal: Normal range of motion. He exhibits no edema and no tenderness.  Neurological: He is alert and oriented to person, place, and time.  Psychiatric: He has a normal mood and affect. His behavior is normal.          Assessment & Plan:  Viral URI with cough  Symptomatic treatment

## 2013-01-27 ENCOUNTER — Other Ambulatory Visit: Payer: Self-pay | Admitting: Family Medicine

## 2013-03-16 ENCOUNTER — Encounter: Payer: Self-pay | Admitting: Family Medicine

## 2013-03-16 ENCOUNTER — Ambulatory Visit (INDEPENDENT_AMBULATORY_CARE_PROVIDER_SITE_OTHER): Payer: PRIVATE HEALTH INSURANCE | Admitting: Family Medicine

## 2013-03-16 VITALS — BP 120/80 | Temp 98.3°F | Wt 176.0 lb

## 2013-03-16 DIAGNOSIS — M79605 Pain in left leg: Secondary | ICD-10-CM

## 2013-03-16 DIAGNOSIS — M545 Low back pain, unspecified: Secondary | ICD-10-CM

## 2013-03-16 MED ORDER — DIAZEPAM 2 MG PO TABS: ORAL_TABLET | ORAL | Status: DC

## 2013-03-16 NOTE — Progress Notes (Signed)
  Subjective:    Patient ID: Grant Castaneda, male    DOB: Jan 21, 1971, 42 y.o.   MRN: [id number]  HPI  Grant Castaneda is a 42 year old male smoker who comes in today for evaluation of acute back pain first episode  Yesterday he was reaching and noticed a sudden onset of severe left lumbar back pain. On a scale of 1-10 it was a 10 now it's down to about a 2-3. The pain radiated down the posterior left thigh. Doing an overnight he woke up and his entire left leg was mom a. That has since resolved with bedrest and tramadol.  No bowel nor bladder dysfunction. No history of trauma except he did play basketball in high school and baseball  Review of Systems    review of systems otherwise negative Objective:   Physical Exam  Well-developed well-nourished male no acute distress examination the spine was normal no scoliosis no tenderness. In the supine position the legs were measured. The right leg is only a quarter-inch shorter than the left.  Sensation muscle strength reflexes all within normal limits. Positive straight leg raising right and left at 30      Assessment & Plan:  Lumbar disc disease plan bed rest medication followup in 3 days

## 2013-03-16 NOTE — Patient Instructions (Addendum)
Bed rest today tomorrow and Saturday  Sunday begin to re\re ambulate,,,,,,,,,,,,, walk,,,,,,,, lie down  Motrin 800 mg twice daily  Valium and tramadol,,,,,,,, one of each 3 times a day while you're at complete bed rest then starting Sunday one of each at bedtime  Return on Monday for followup

## 2013-03-17 ENCOUNTER — Other Ambulatory Visit: Payer: Self-pay | Admitting: *Deleted

## 2013-03-17 DIAGNOSIS — M26629 Arthralgia of temporomandibular joint, unspecified side: Secondary | ICD-10-CM

## 2013-03-17 MED ORDER — TRAMADOL HCL 50 MG PO TABS: ORAL_TABLET | ORAL | Status: DC

## 2013-03-20 ENCOUNTER — Encounter: Payer: Self-pay | Admitting: Family Medicine

## 2013-03-20 ENCOUNTER — Ambulatory Visit (INDEPENDENT_AMBULATORY_CARE_PROVIDER_SITE_OTHER): Payer: PRIVATE HEALTH INSURANCE | Admitting: Family Medicine

## 2013-03-20 VITALS — BP 110/80

## 2013-03-20 DIAGNOSIS — M79605 Pain in left leg: Secondary | ICD-10-CM

## 2013-03-20 DIAGNOSIS — M545 Low back pain: Secondary | ICD-10-CM

## 2013-03-20 NOTE — Progress Notes (Signed)
  Subjective:    Patient ID: Grant Castaneda, male    DOB: [birth year], 42 y.o.   MRN: [id number]  HPI Grant Castaneda  is a 42 year old married malex moker who comes in today for followup of back pain,,,,,,,,,he quit smoking about 12 weeks ago  We saw him last week with severe lumbar disc disease. We put him at bedrest for 48 hours and gave him 2 mg of Valium 3 times a day along with tramadol. He comes back today for followup saying he is 95% better   Review of Systems Review of systems negative no neurologic deficits    Objective:   Physical Exam  Well-developed and nourished male no acute distress exam improved flexibility improved no neurologic deficit gait normal      Assessment & Plan:  Low back pain resolving plan begin exercise program daily to prevent future episodes also discontinue smoking

## 2013-03-20 NOTE — Patient Instructions (Signed)
Continue not to smoke  Daily exercises in the morning  Return when necessary

## 2013-12-13 ENCOUNTER — Other Ambulatory Visit: Payer: Self-pay | Admitting: Family Medicine

## 2014-01-22 ENCOUNTER — Encounter: Payer: Self-pay | Admitting: Family Medicine

## 2014-01-22 ENCOUNTER — Ambulatory Visit (INDEPENDENT_AMBULATORY_CARE_PROVIDER_SITE_OTHER): Payer: PRIVATE HEALTH INSURANCE | Admitting: Family Medicine

## 2014-01-22 VITALS — BP 120/88 | Temp 98.0°F | Wt 183.0 lb

## 2014-01-22 DIAGNOSIS — M79609 Pain in unspecified limb: Secondary | ICD-10-CM

## 2014-01-22 DIAGNOSIS — M79644 Pain in right finger(s): Secondary | ICD-10-CM | POA: Insufficient documentation

## 2014-01-22 NOTE — Progress Notes (Signed)
Pre visit review using our clinic review tool, if applicable. No additional management support is needed unless otherwise documented below in the visit note. 

## 2014-01-22 NOTE — Patient Instructions (Signed)
Motrin 600 mg twice daily with food  Splints in the morning and remove the tape at bedtime  Elevation and ice when necessary  If after 3-4 weeks you don't see any improvement with the pain gets worse call Dr. sypher at the [facility]

## 2014-01-22 NOTE — Progress Notes (Signed)
   Subjective:    Patient ID: Grant Castaneda, male    DOB: [birth year], 43 y.o.   MRN: [id number]  HPI Grant Castaneda is a 43 year old male who comes in today for evaluation of pain in the fifth fingers right hand  He states year and a half ago he was working at the gym on a punching bag. He had gloves on but not as severe pain in the fifth fingers right hand. The pain lasted for about 2 weeks it went away but he noticed a deformity in that finger. He's been asymptomatic until about a month ago when somebody squeezed that hand and since then he said severe pain.   Review of Systems    do systems negative Objective:   Physical Exam Well-developed well-nourished in no acute distress vital signs stable he is afebrile examination of the right hand shows an obvious 10 to 15 deformity of the right fifth finger  Full range of motion tenderness at the base of that finger       Assessment & Plan:  Old fracture right fifth finger,,,,, elevation ice splinting Motrin him consult when necessary

## 2014-01-23 ENCOUNTER — Telehealth: Payer: Self-pay | Admitting: Family Medicine

## 2014-01-23 NOTE — Telephone Encounter (Signed)
Relevant patient education assigned to patient using Emmi. ° °

## 2014-09-24 ENCOUNTER — Other Ambulatory Visit: Payer: Self-pay | Admitting: Family Medicine

## 2017-03-11 ENCOUNTER — Other Ambulatory Visit (HOSPITAL_BASED_OUTPATIENT_CLINIC_OR_DEPARTMENT_OTHER): Payer: Self-pay | Admitting: General Practice

## 2017-03-11 ENCOUNTER — Ambulatory Visit (HOSPITAL_BASED_OUTPATIENT_CLINIC_OR_DEPARTMENT_OTHER)
Admission: RE | Admit: 2017-03-11 | Discharge: 2017-03-11 | Disposition: A | Discharge status: Home / Self Care | Payer: Commercial Managed Care - PPO | Source: Ambulatory Visit | Attending: General Practice | Admitting: General Practice

## 2017-03-11 DIAGNOSIS — M25552 Pain in left hip: Secondary | ICD-10-CM | POA: Diagnosis present

## 2017-07-09 ENCOUNTER — Encounter: Payer: Self-pay | Admitting: Family Medicine

## 2018-11-13 IMAGING — DX DG HIP (WITH OR WITHOUT PELVIS) 2-3V*L*
3 series · 3 of 3 positions shown · non-contrast
Comparison: CT Abdomen and Pelvis 02/10/2010

CLINICAL DATA: 46-year-old male with 6-7 months of left hip pain
with no known injury.

EXAM:
DG HIP (WITH OR WITHOUT PELVIS) 2-3V LEFT

[pelvis ap]
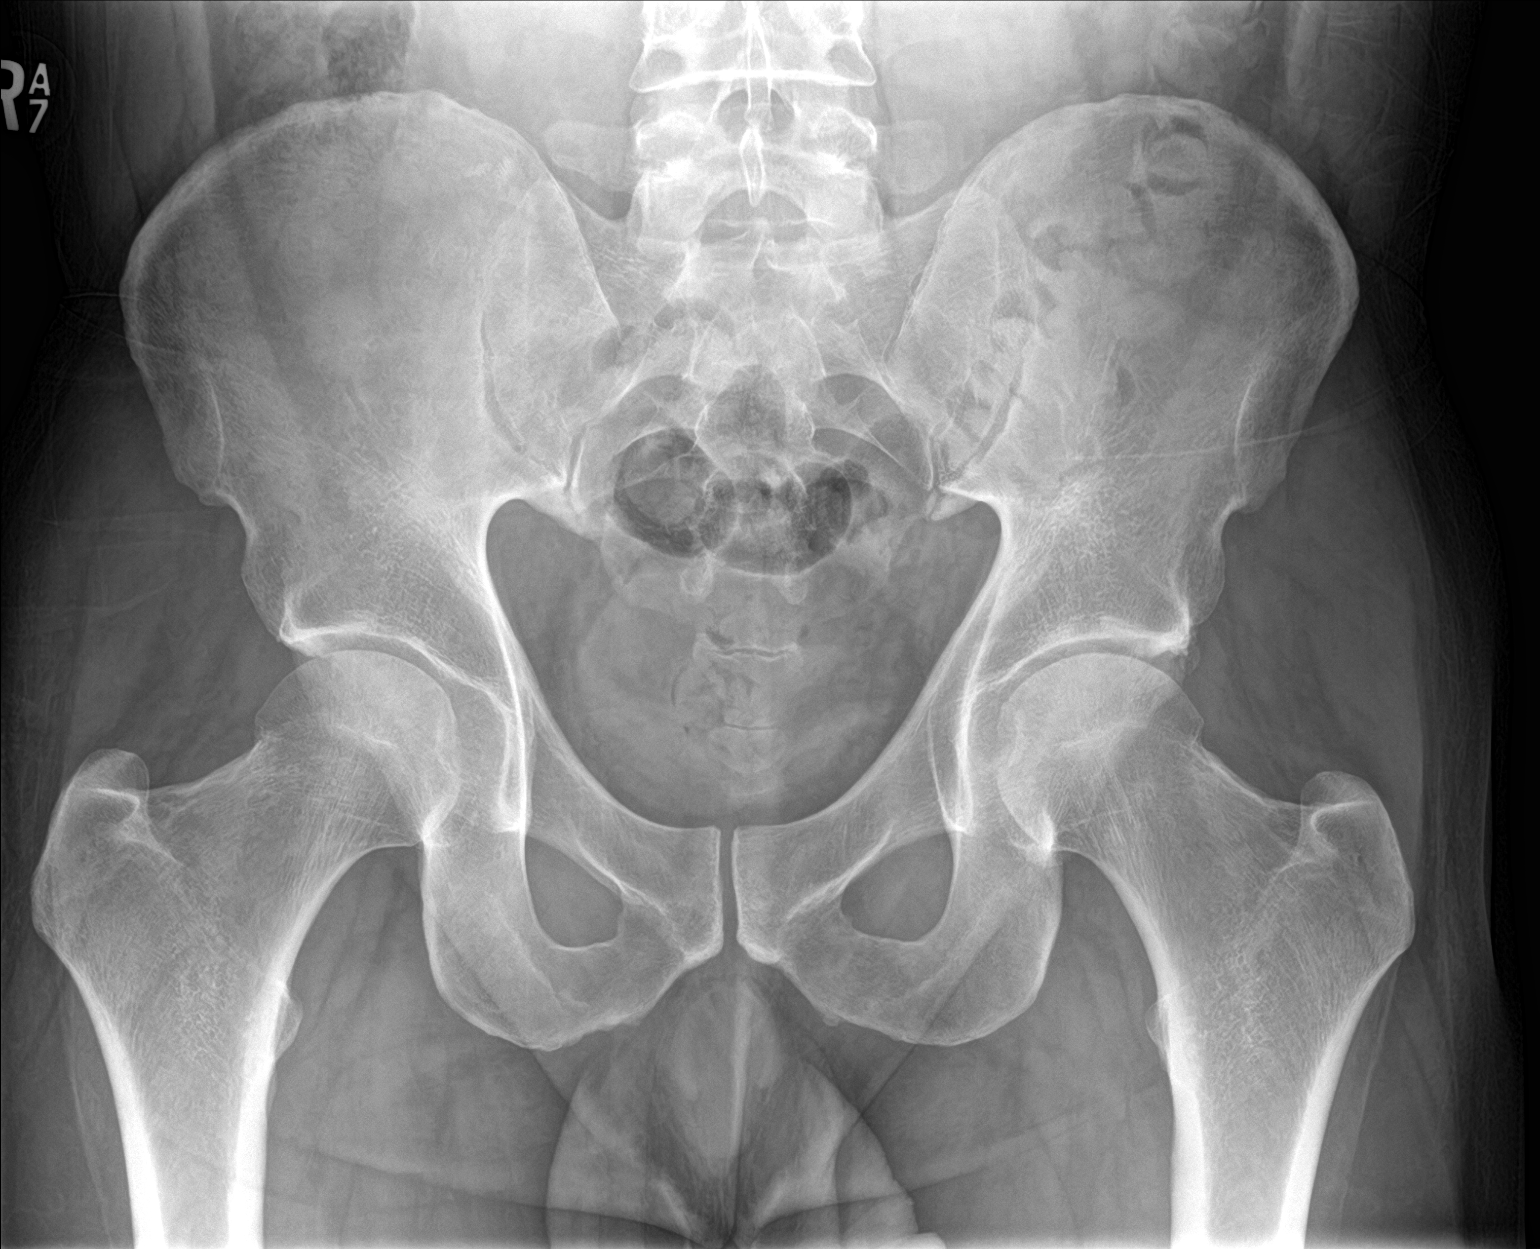

[hip ap]
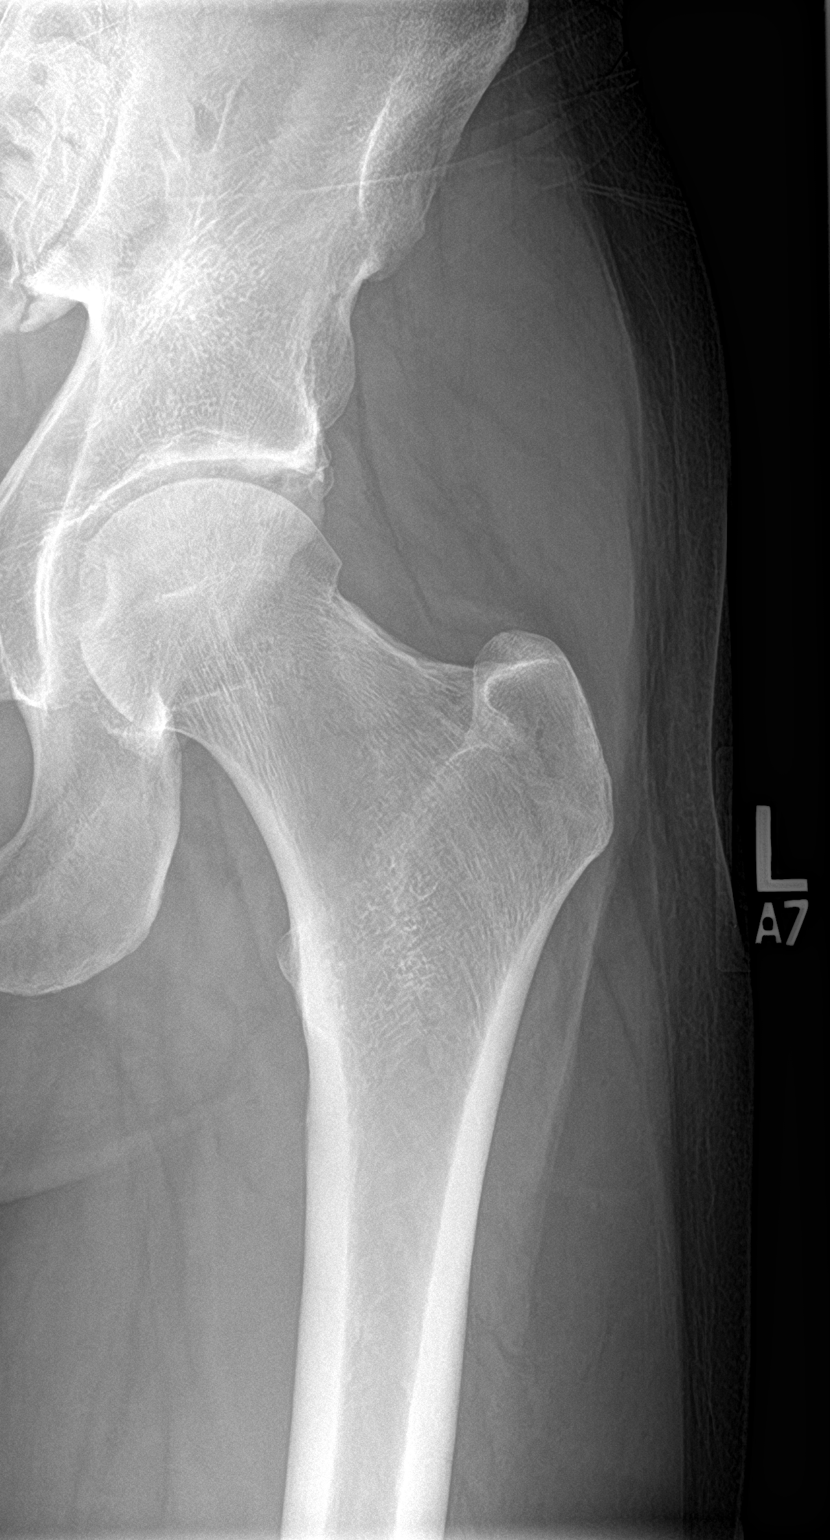

[hip frog leg]
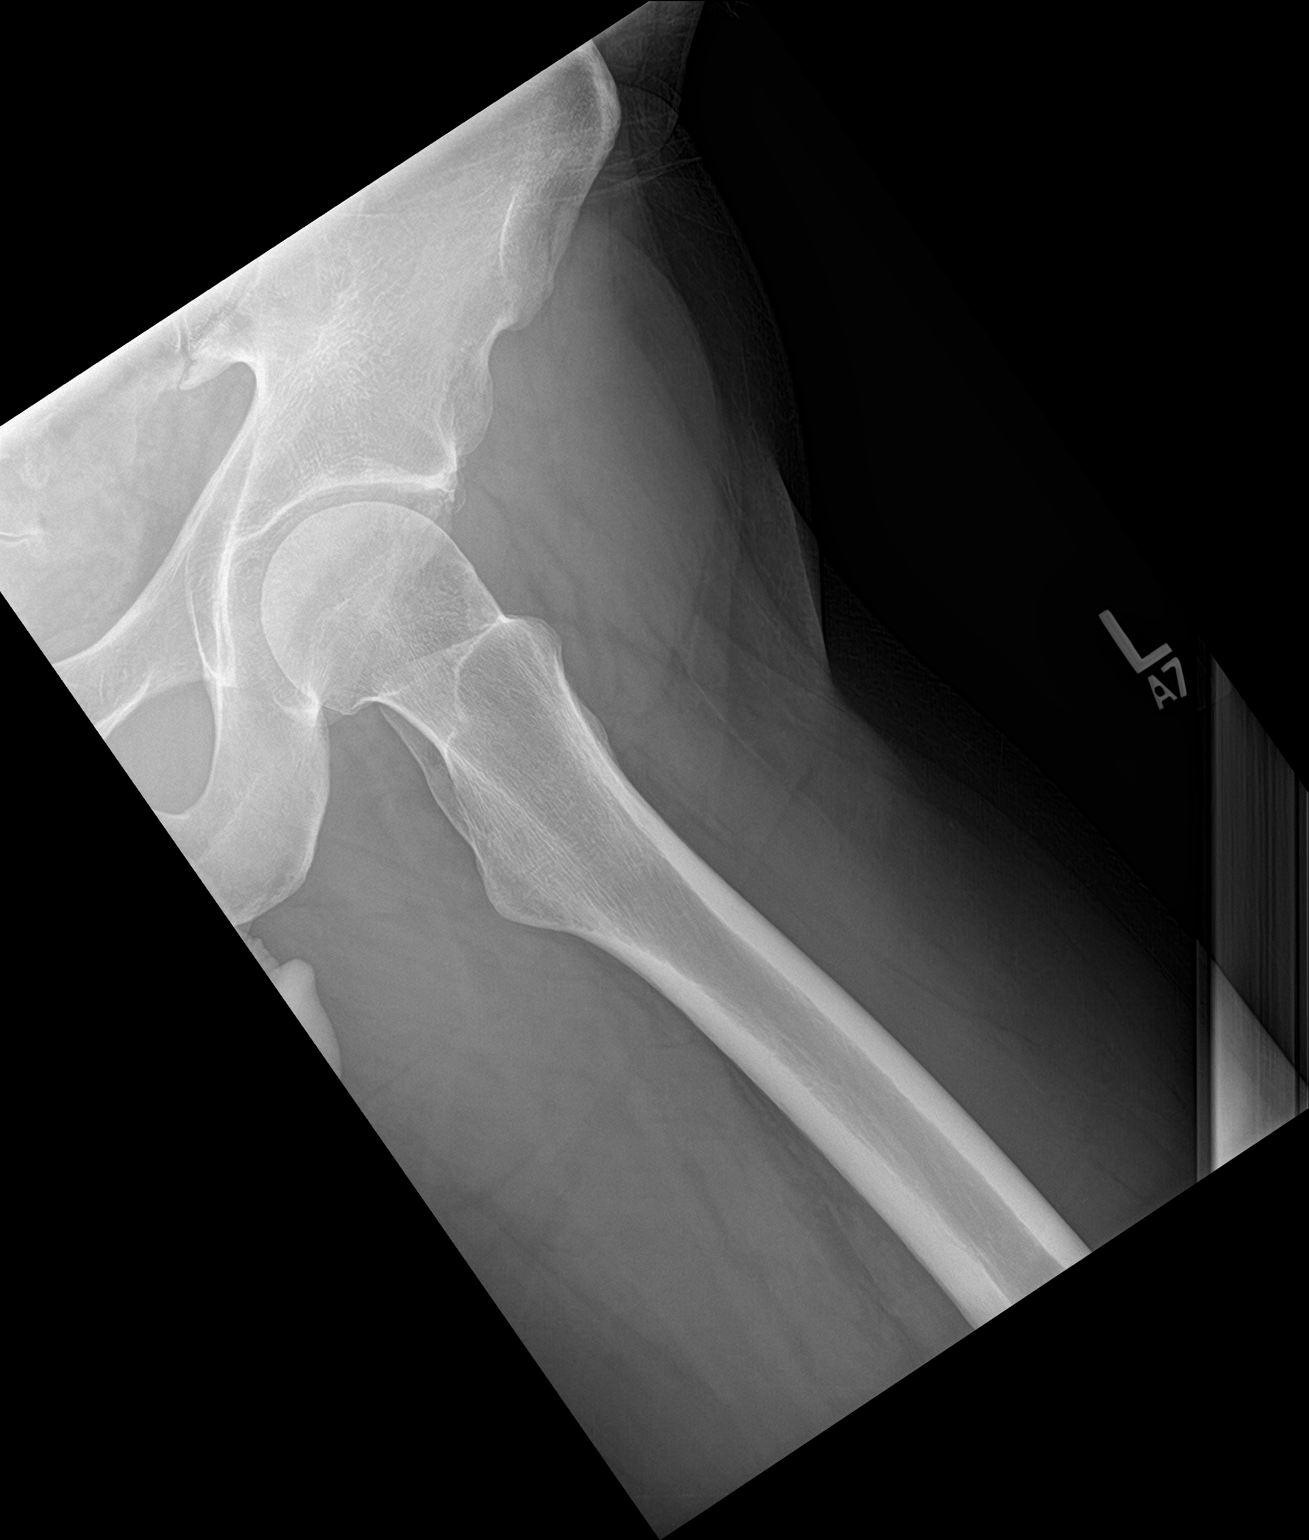

[3 of 3 positions shown; findings below may reference images not displayed]

FINDINGS: Bone mineralization is within normal limits. Femoral heads are
normally located. Hip joint spaces appear symmetric, stable since
8288, and within normal limits. There is mild acetabular subchondral
sclerosis which is chronically greater on the left. Intact pelvis.
Normal sacral ala and SI joints. Intact proximal right femur. The
proximal left femur is intact and appears normal.
IMPRESSION: Questionable early osteoarthritis changes at the left hip in the
form of asymmetric left acetabular sclerosis. Joint spaces appear
symmetric and within normal limits. No acute osseous abnormality
identified.
